# Patient Record
Sex: Female | Born: 2000 | Race: White | Hispanic: No | Marital: Single | Smoking: Former smoker
Health system: Southern US, Community
[De-identification: ages and names within clinical notes are randomized; demographics above are authoritative.]

## PROBLEM LIST (undated history)

## (undated) DIAGNOSIS — F418 Other specified anxiety disorders: Secondary | ICD-10-CM

## (undated) DIAGNOSIS — F319 Bipolar disorder, unspecified: Secondary | ICD-10-CM

## (undated) DIAGNOSIS — F429 Obsessive-compulsive disorder, unspecified: Secondary | ICD-10-CM

---
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
## 2022-05-15 ENCOUNTER — Encounter: Payer: Self-pay | Admitting: Physician Assistant

## 2022-05-15 ENCOUNTER — Ambulatory Visit (INDEPENDENT_AMBULATORY_CARE_PROVIDER_SITE_OTHER): Admitting: Physician Assistant

## 2022-05-15 VITALS — BP 112/79 | HR 81

## 2022-05-15 DIAGNOSIS — F3341 Major depressive disorder, recurrent, in partial remission: Secondary | ICD-10-CM

## 2022-05-15 DIAGNOSIS — F411 Generalized anxiety disorder: Secondary | ICD-10-CM

## 2022-05-15 MED ORDER — SERTRALINE HCL 50 MG PO TABS: 50.0000 mg | ORAL_TABLET | Freq: Every day | ORAL | 1 refills | Status: DC

## 2022-05-15 MED ORDER — BUSPIRONE HCL 5 MG PO TABS: 5.0000 mg | ORAL_TABLET | Freq: Two times a day (BID) | ORAL | 1 refills | Status: DC

## 2022-05-15 MED ORDER — PROPRANOLOL HCL 20 MG PO TABS: 10.0000 mg | ORAL_TABLET | Freq: Two times a day (BID) | ORAL | 0 refills | Status: DC | PRN

## 2022-05-15 NOTE — Progress Notes (Signed)
[facility]  Patient ID: Candice Meyer,  MRN: [id number]  PCP: Nyoka Cowden, MD  Date of Evaluation: 05/15/2022 Time spent:20 minutes  Chief Complaint:  Chief Complaint   Anxiety; Depression; Follow-up    HISTORY/CURRENT STATUS: HPI for routine med check.   She decreased Zoloft b/c suppressed her appetite. Is able to eat more now. No weight loss.  Denies laxative use, calorie restricting, or binging and purging.  The anxiety is a little bit worse since decreasing the Zoloft.  She does have panic attacks occasionally, a few times a week.  She ask specifically about propranolol.  She has read about it and would like to try if appropriate.  She does get a racing heart and feel palpitations when she gets super anxious.  Patient is able to enjoy things.  Energy and motivation are good.  Has been out of college for the summer but will be going back in a week or so.  Starting [employer] studying psychology.  No extreme sadness, tearfulness, or feelings of hopelessness.  Sleeps well most of the time. ADLs and personal hygiene are normal.  Denies any changes in concentration, making decisions, or remembering things.  Denies cutting or any form of self-harm.  Denies suicidal or homicidal thoughts.  Patient denies increased energy with decreased need for sleep, increased talkativeness, racing thoughts, impulsivity or risky behaviors, increased spending, increased libido, grandiosity, increased irritability or anger, paranoia, and no hallucinations.  Denies dizziness, syncope, seizures, numbness, tingling, tremor, tics, unsteady gait, slurred speech, confusion. Denies muscle or joint pain, stiffness, or dystonia.  Individual Medical History/ Review of Systems: Changes? :Yes    dislocation right patella in April, no surgery needed. Is better.   Past medications for mental health diagnoses include: Cymbalta, Hydroxyzine, Gabapentin, Lithobid, Wellbutrin, Zoloft caused her to feel jittery,  (Adderall from a friend, 'wasn't a fan.') Strattera 'didn't like it.' Seroquel caused dizziness.   OD on Wellbutrin. Was in [facility] hospital in [place] for a week, around 2019.   Allergies: Penicillins  Current Medications:  Current Outpatient Medications:    busPIRone (BUSPAR) 5 MG tablet, Take 1 tablet (5 mg total) by mouth 2 (two) times daily., Disp: 60 tablet, Rfl: 1   Multiple Vitamin (MULTIVITAMIN) tablet, Take 1 tablet by mouth daily., Disp: , Rfl:    propranolol (INDERAL) 20 MG tablet, Take 0.5-1 tablets (10-20 mg total) by mouth 2 (two) times daily as needed., Disp: 60 tablet, Rfl: 0   sertraline (ZOLOFT) 50 MG tablet, Take 1 tablet (50 mg total) by mouth daily., Disp: 30 tablet, Rfl: 1 Medication Side Effects: none  Family Medical/ Social History: Changes? See HPI  MENTAL HEALTH EXAM:  Blood pressure 112/79, pulse 81.There is no height or weight on file to calculate BMI.  General Appearance: Casual and Well Groomed  Eye Contact:  Good  Speech:  Clear and Coherent and Normal Rate  Volume:  Normal  Mood:  Anxious  Affect:  Anxious  Thought Process:  Goal Directed and Descriptions of Associations: Circumstantial  Orientation:  Full (Time, Place, and Person)  Thought Content: Logical   Suicidal Thoughts:  No  Homicidal Thoughts:  No  Memory:  WNL  Judgement:  Good  Insight:  Good  Psychomotor Activity:  Normal  Concentration:  Concentration: Fair  Recall:  Good  Fund of Knowledge: Good  Language: Good  Assets:  Desire for Improvement  ADL's:  Intact  Cognition: WNL  Prognosis:  Good    DIAGNOSES:    ICD-10-CM  1. Depression, major, recurrent, in partial remission (HCC)  F33.41     2. Generalized anxiety disorder  F41.1      Receiving Psychotherapy: No   RECOMMENDATIONS:  PDMP was reviewed.  No controlled substances. I provided 20  minutes of face to face time during this encounter, including time spent before and after the visit in records review, medical  decision making, counseling pertinent to today's visit, and charting.   Discussed anxiety. Recommend adding Buspar, to help prevent the anxiety.  It is fine to use the propranolol but only as a rescue medication.  This was explained to her.  Benefits, risks and side effects about both medications were discussed and she accepts.  Reminded her not to make medication changes without discussing with me.  She seems to be tolerating the very low dose of Zoloft better and has not noticed symptoms of depression.  Start BuSpar 5 mg, 1 p.o. twice daily. Start propranolol 20 mg, 1/2-1 p.o. twice daily as needed anxiety. Continue Zoloft 25 mg, 1 p.o. daily.  (I am leaving it as 50 mg daily on her chart, so she will get more for her money but also we will have the option of increasing it if needed.)   Continue multivitamin.  Recommend vitamin D and B complex. Return in 6 wks.  Melony Overly, PA-C

## 2022-05-23 ENCOUNTER — Ambulatory Visit: Admitting: Physician Assistant

## 2022-06-11 ENCOUNTER — Other Ambulatory Visit: Payer: Self-pay | Admitting: Physician Assistant

## 2022-06-20 ENCOUNTER — Ambulatory Visit: Admitting: Physician Assistant

## 2022-07-11 ENCOUNTER — Ambulatory Visit: Admitting: Physician Assistant

## 2022-08-07 ENCOUNTER — Ambulatory Visit (INDEPENDENT_AMBULATORY_CARE_PROVIDER_SITE_OTHER): Admitting: Physician Assistant

## 2022-08-07 ENCOUNTER — Encounter: Payer: Self-pay | Admitting: Physician Assistant

## 2022-08-07 VITALS — BP 108/75 | HR 71 | Wt 132.2 lb

## 2022-08-07 DIAGNOSIS — F411 Generalized anxiety disorder: Secondary | ICD-10-CM

## 2022-08-07 DIAGNOSIS — F900 Attention-deficit hyperactivity disorder, predominantly inattentive type: Secondary | ICD-10-CM | POA: Diagnosis not present

## 2022-08-07 MED ORDER — PROPRANOLOL HCL 20 MG PO TABS: 10.0000 mg | ORAL_TABLET | Freq: Two times a day (BID) | ORAL | 0 refills | Status: AC | PRN

## 2022-08-07 MED ORDER — ATOMOXETINE HCL 18 MG PO CAPS: 18.0000 mg | ORAL_CAPSULE | Freq: Every day | ORAL | 1 refills | Status: DC

## 2022-08-07 NOTE — Progress Notes (Signed)
[facility]  Patient ID: Candice Meyer,  MRN: [id number]  PCP: Helene Kelp, MD  Date of Evaluation: 08/07/2022 Time spent:20 minutes  Chief Complaint:  Chief Complaint   ADD; Anxiety; Follow-up    HISTORY/CURRENT STATUS: HPI for routine med check.   OCD sx, has been reading up on it (psych classes in college) and thinks she has it. No compulsions, just can't get things off her mind. Ruminates, hard time relaxing b/c of it. Thoughts do not take time out of her day or 'control her' in any way.   GAD is present. Started Inderal at [facility] and it's helped a lot. Doesn't use often. Buspar was stopped, didn't think she needed it. No PA, just gets overwhelmed at times.   Thinks she has ADHD and would like to treat it. Took Strattera in the past but doesn't remember it now. She said in the past that she 'didn't like it' but doesn't remember it. Took Adderall from a friend in the past and didn't like it. Needs help staying on task and getting things done in a timely manner.  Patient is able to enjoy things.  Energy and motivation are good.  School is going well, studying psych in college.   No extreme sadness, tearfulness, or feelings of hopelessness.  Sleeps well most of the time. ADLs and personal hygiene are normal.   Appetite has not changed.  Weight is stable.  Denies laxative use, calorie restricting, or binging and purging.   Denies cutting or any form of self-harm.  Denies suicidal or homicidal thoughts.  Patient denies increased energy with decreased need for sleep, increased talkativeness, racing thoughts, impulsivity or risky behaviors, increased spending, increased libido, grandiosity, increased irritability or anger, paranoia, or hallucinations.  Denies dizziness, syncope, seizures, numbness, tingling, tremor, tics, unsteady gait, slurred speech, confusion. Denies muscle or joint pain, stiffness, or dystonia.  Individual Medical History/ Review of Systems: Changes?  :No       Past medications for mental health diagnoses include: Cymbalta, Hydroxyzine, Gabapentin, Lithobid, Wellbutrin, Zoloft caused her to feel jittery, (Adderall from a friend, 'wasn't a fan.') Strattera 'didn't like it.' Seroquel caused dizziness.   OD on Wellbutrin. Was in [facility] hospital in [place] for a week, around 2019.   Allergies: Penicillins  Current Medications:  Current Outpatient Medications:    atomoxetine (STRATTERA) 18 MG capsule, Take 1 capsule (18 mg total) by mouth daily., Disp: 30 capsule, Rfl: 1   Multiple Vitamin (MULTIVITAMIN) tablet, Take 1 tablet by mouth daily., Disp: , Rfl:    propranolol (INDERAL) 20 MG tablet, Take 0.5-1 tablets (10-20 mg total) by mouth 2 (two) times daily as needed., Disp: 60 tablet, Rfl: 0 Medication Side Effects: none  Family Medical/ Social History: Changes? See HPI  MENTAL HEALTH EXAM:  Blood pressure 108/75, pulse 71, weight 132 lb 3.2 oz (60 kg).Body mass index is 23.42 kg/m.  General Appearance: Casual and Well Groomed  Eye Contact:  Good  Speech:  Clear and Coherent and Normal Rate  Volume:  Normal  Mood:  Euthymic  Affect:  Congruent  Thought Process:  Goal Directed and Descriptions of Associations: Circumstantial  Orientation:  Full (Time, Place, and Person)  Thought Content: Logical   Suicidal Thoughts:  No  Homicidal Thoughts:  No  Memory:  WNL  Judgement:  Good  Insight:  Good  Psychomotor Activity:  Normal  Concentration:  Concentration: Fair and Attention Span: Fair  Recall:  Good  Fund of Knowledge: Good  Language: Good  Assets:  Desire for Improvement  ADL's:  Intact  Cognition: WNL  Prognosis:  Good   DIAGNOSES:    ICD-10-CM   1. Generalized anxiety disorder  F41.1     2. Attention deficit hyperactivity disorder (ADHD), predominantly inattentive type  F90.0      Receiving Psychotherapy: No   RECOMMENDATIONS:  PDMP was reviewed.  No controlled substances. I provided 20  minutes of face to face  time during this encounter, including time spent before and after the visit in records review, medical decision making, counseling pertinent to today's visit, and charting.   Discussed options for ADD. Recommend a nonstimulant because possible increased anxiety with a stimulant. Explained options of Strattera, Quelbree, clonodine or guanfacine. We decide to retry Strattera. Benefits, risks, and side effects discussed and she accepts.  Restart strattera 18 mg 1 daily. continue  propranolol 20 mg, 1/2-1 p.o. twice daily as needed anxiety. Continue multivitamin.  Recommend vitamin D and B complex. Recommend therapy. Return in 4 wks.  Melony Overly, PA-C

## 2022-09-12 ENCOUNTER — Ambulatory Visit: Admitting: Physician Assistant
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
## 2023-05-13 ENCOUNTER — Encounter (HOSPITAL_BASED_OUTPATIENT_CLINIC_OR_DEPARTMENT_OTHER): Payer: Self-pay

## 2023-05-13 ENCOUNTER — Other Ambulatory Visit: Payer: Self-pay

## 2023-05-13 DIAGNOSIS — E162 Hypoglycemia, unspecified: Secondary | ICD-10-CM | POA: Diagnosis not present

## 2023-05-13 DIAGNOSIS — R1031 Right lower quadrant pain: Secondary | ICD-10-CM | POA: Insufficient documentation

## 2023-05-13 LAB — CBC
HCT: 38.7 % (ref 36.0–46.0)
Hemoglobin: 13.4 g/dL (ref 12.0–15.0)
MCH: 29.6 pg (ref 26.0–34.0)
MCHC: 34.6 g/dL (ref 30.0–36.0)
MCV: 85.6 fL (ref 80.0–100.0)
Platelets: 333 10*3/uL (ref 150–400)
RBC: 4.52 MIL/uL (ref 3.87–5.11)
RDW: 12.2 % (ref 11.5–15.5)
WBC: 7.1 10*3/uL (ref 4.0–10.5)
nRBC: 0 % (ref 0.0–0.2)

## 2023-05-13 NOTE — ED Triage Notes (Signed)
Pt arrived POV for over a month of right sided abd pain that is intermittent. Reports sharp pain "Feels like the pain is like 4in deep inside me". Denies urinary symptoms, denies n/v/d. Eating does not make pain worse, reports movement does not increase pain. Denies pain at current.

## 2023-05-14 ENCOUNTER — Emergency Department (HOSPITAL_BASED_OUTPATIENT_CLINIC_OR_DEPARTMENT_OTHER)
Admission: EM | Admit: 2023-05-14 | Discharge: 2023-05-14 | Disposition: A | Discharge status: Home / Self Care | Attending: Emergency Medicine | Admitting: Emergency Medicine

## 2023-05-14 DIAGNOSIS — R1031 Right lower quadrant pain: Secondary | ICD-10-CM

## 2023-05-14 HISTORY — DX: Other specified anxiety disorders: F41.8

## 2023-05-14 HISTORY — DX: Obsessive-compulsive disorder, unspecified: F42.9

## 2023-05-14 HISTORY — DX: Bipolar disorder, unspecified: F31.9

## 2023-05-14 NOTE — ED Provider Notes (Signed)
[place] EMERGENCY DEPARTMENT AT [facility] Provider Note   CSN: [id number] Arrival date & time: 05/13/23  2301     History  Chief Complaint  Patient presents with   Abdominal Pain    Candice Meyer is a 22 y.o. female.  The history is provided by the patient.   Patient presents with abdominal pain.  Patient reports she has had brief episodes of abdominal pain over the past month.  It is mostly right-sided.  It last for about 10 seconds, sharp and then resolved.  Reports at times it is worse with exercise.  Eating does not make it worse.  She is pain-free at this time No fevers or vomiting.  No dysuria is reported.  No previous abdominal surgery.  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 Authorizing Provider  atomoxetine (STRATTERA) 18 MG capsule Take 1 capsule (18 mg total) by mouth daily. 08/07/22   Melony Overly T, PA-C  Multiple Vitamin (MULTIVITAMIN) tablet Take 1 tablet by mouth daily.    [provider]  propranolol (INDERAL) 20 MG tablet Take 0.5-1 tablets (10-20 mg total) by mouth 2 (two) times daily as needed. 08/07/22   Cherie Ouch, PA-C      Allergies    Penicillins    Review of Systems   Review of Systems  Constitutional:  Negative for fever.  Gastrointestinal:  Positive for abdominal pain. Negative for vomiting.  Genitourinary:  Negative for dysuria.    Physical Exam Updated Vital Signs BP 110/70 (BP Location: Right Arm)   Pulse 85   Temp 98 F (36.7 C) (Oral)   Resp 16   Ht 1.6 m (5\' 3" )   Wt 57.2 kg   LMP 05/13/2023 (Exact Date)   SpO2 100%   BMI 22.32 kg/m  Physical Exam CONSTITUTIONAL: Well developed/well nourished, no distress typing on her computer HEAD: Normocephalic/atraumatic EYES: EOMI/PERRL, no icterus ENMT: Mucous membranes moist NECK: supple no meningeal signs CV: S1/S2 noted, no  murmurs/rubs/gallops noted LUNGS: Lungs are clear to auscultation bilaterally, no apparent distress ABDOMEN: soft, nontender, no rebound or guarding, bowel sounds noted throughout abdomen GU:no cva tenderness NEURO: Pt is awake/alert/appropriate, moves all extremitiesx4.  No facial droop.   SKIN: warm, color normal PSYCH: no abnormalities of mood noted, alert and oriented to situation  ED Results / Procedures / Treatments   Labs (all labs ordered are listed, but only abnormal results are displayed) Labs Reviewed  COMPREHENSIVE METABOLIC PANEL - Abnormal; Notable for the following components:      Result Value   Potassium 3.3 (*)    Glucose, Bld 59 (*)    Alkaline Phosphatase 22 (*)    All other components within normal limits  URINALYSIS, ROUTINE W REFLEX MICROSCOPIC - Abnormal; Notable for the following components:   Color, Urine COLORLESS (*)    All other components within normal limits  LIPASE, BLOOD  CBC  PREGNANCY, URINE    EKG None  Radiology No results found.  Procedures Procedures    Medications Ordered in ED Medications - No data to display  ED Course/ Medical Decision Making/ A&P                                 Medical Decision Making Amount and/or Complexity of Data Reviewed Labs: ordered.   This  patient presents to the ED for concern of abdominal pain, this involves an extensive number of treatment options, and is a complaint that carries with it a high risk of complications and morbidity.  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  The pertinent results include: Mild hypoglycemia   Test  Considered: Considered CT imaging but since patient is pain-free will defer at this time   Reevaluation: After the interventions noted above, I reevaluated the patient and found that they have :stayed the same  Complexity of problems addressed: Patient's presentation is most consistent with  acute complicated illness/injury requiring diagnostic workup  Disposition: After consideration of the diagnostic results and the patient's response to treatment,  I feel that the patent would benefit from discharge   .    Patient presents for evaluation of abdominal pain she has had intermittently for a month.  She is currently pain-free. Labs overall unremarkable except for mild hypoglycemia but she is eating and drinking. No indication for emergent imaging at this time.  Advised she may need follow-up as an outpatient        Final Clinical Impression(s) / ED Diagnoses Final diagnoses:  Right lower quadrant abdominal pain    Rx / DC Orders ED Discharge Orders     None         Candice Rhine, MD 05/14/23 [phone]

## 2023-05-14 NOTE — ED Notes (Signed)
Pt. Blood sugar is 59. Pt. Given soda at her request

## 2023-05-14 NOTE — ED Notes (Signed)
Pt. Refuses to have BP cuff and O2 sat sensor on

## 2023-05-14 NOTE — Discharge Instructions (Signed)

## 2024-06-20 ENCOUNTER — Telehealth: Payer: Self-pay | Admitting: Physician Assistant

## 2024-06-20 NOTE — Telephone Encounter (Signed)
 Patient has not been seen since 2023. Told her that we could not RF medication. Suggested she contact PCP and she said she didn't have one. She does not have insurance currently and was asking how much an out of pocket visit would be. Transferred her to [place] to discuss.

## 2024-06-20 NOTE — Telephone Encounter (Signed)
 Pt called and said she has restarted some 'old' Zoloft  that she was prescribed back in 2023 in the last month. Is wanting a RF but I told her she would probably need to have an appt. She has insurance issues right now and can't make an appt.

## 2024-07-20 ENCOUNTER — Encounter: Payer: Self-pay | Admitting: Physician Assistant

## 2024-07-20 ENCOUNTER — Ambulatory Visit (INDEPENDENT_AMBULATORY_CARE_PROVIDER_SITE_OTHER): Admitting: Physician Assistant

## 2024-07-20 DIAGNOSIS — F319 Bipolar disorder, unspecified: Secondary | ICD-10-CM | POA: Diagnosis not present

## 2024-07-20 DIAGNOSIS — F900 Attention-deficit hyperactivity disorder, predominantly inattentive type: Secondary | ICD-10-CM | POA: Diagnosis not present

## 2024-07-20 DIAGNOSIS — F411 Generalized anxiety disorder: Secondary | ICD-10-CM | POA: Diagnosis not present

## 2024-07-20 MED ORDER — ARIPIPRAZOLE 10 MG PO TABS: 10.0000 mg | ORAL_TABLET | ORAL | 1 refills | Status: DC

## 2024-07-20 NOTE — Progress Notes (Signed)
 [facility]  Patient ID: Candice Meyer,  MRN: [id number]  PCP: Geralene Ivy, MD  Date of Evaluation: 07/20/2024 Time spent:30 minutes  Chief Complaint:  Chief Complaint   Manic Behavior; Depression    HISTORY/CURRENT STATUS: HPI last appointment 08/07/2022, should have returned 4 weeks later.   States for 6 months this year, she was manic.  Grandiose, risky sexual behaviors, wasn't sleeping well, was paranoid, had delusions off and on, thinking she needed to escape 'the Matrix.'  She's a psychology major in school, and did research about her sx.  Wants a Rx for Zoloft  that took several years ago.  She recently found some old Zoloft  and started it.  States it made her feel really good.  She had more energy was able to stay up late and do things she would not have done.  She does not elaborate.  She quit drinking 1 week ago, and quit smoking weed 4 months ago.  Not working now, lost her job.  Still going to class, but failing all classes b/c she doesn't turn in her work.  In between times of feeling really good she feels depressed, cries easily, does not want to do anything.  Sleeps a lot.  Personal hygiene can be lacking during those times.  It is hard for her to say how often the cycles occur.  Appetite is normal and weight is stable.  She has a hard time focusing and staying on task.  No panic attacks.  No suicidal or homicidal thoughts.  Individual Medical History/ Review of Systems: Changes? :No       Past medications for mental health diagnoses include: Cymbalta , Hydroxyzine , Gabapentin , Lithobid , Wellbutrin, Zoloft  caused her to feel jittery, (Adderall from a friend, 'wasn't a fan.') Strattera  'didn't like it.' Seroquel  caused dizziness (07/20/2024 now says she never took it.)  OD on Wellbutrin. Was in [facility]al hospital in [place] for a week, around 2019.   Allergies: Penicillins  Current Medications:  Current Outpatient Medications:    ARIPiprazole (ABILIFY) 10  MG tablet, Take 1 tablet (10 mg total) by mouth every morning., Disp: 30 tablet, Rfl: 1   Multiple Vitamin (MULTIVITAMIN) tablet, Take 1 tablet by mouth daily., Disp: , Rfl:    atomoxetine  (STRATTERA ) 18 MG capsule, Take 1 capsule (18 mg total) by mouth daily. (Patient not taking: Reported on 07/20/2024), Disp: 30 capsule, Rfl: 1   propranolol  (INDERAL ) 20 MG tablet, Take 0.5-1 tablets (10-20 mg total) by mouth 2 (two) times daily as needed. (Patient not taking: Reported on 07/20/2024), Disp: 60 tablet, Rfl: 0 Medication Side Effects: none  Family Medical/ Social History: Changes? See HPI  MENTAL HEALTH EXAM:  There were no vitals taken for this visit.There is no height or weight on file to calculate BMI.  General Appearance: Casual and Well Groomed  Eye Contact:  Good  Speech:  Clear and Coherent and Normal Rate  Volume:  Normal  Mood:  Euphoric  Affect:  Congruent  Thought Process:  Goal Directed and Descriptions of Associations: Circumstantial  Orientation:  Full (Time, Place, and Person)  Thought Content: Logical   Suicidal Thoughts:  No  Homicidal Thoughts:  No  Memory:  WNL  Judgement:  Good  Insight:  Good  Psychomotor Activity:  Normal  Concentration:  Concentration: Fair and Attention Span: Fair  Recall:  Good  Fund of Knowledge: Good  Language: Good  Assets:  Communication Skills Financial Resources/Insurance Housing Leisure Time Physical Health  ADL's:  Intact  Cognition: WNL  Prognosis:  Good   Labs from 05/13/2023 reviewed.   DIAGNOSES:    ICD-10-CM   1. Bipolar I disorder with rapid cycling (HCC)  F31.9     2. Attention deficit hyperactivity disorder (ADHD), predominantly inattentive type  F90.0     3. Generalized anxiety disorder  F41.1       Receiving Psychotherapy: No   RECOMMENDATIONS:  PDMP was reviewed.  No controlled substances. I provided approximately 30 minutes of face to face time during this encounter, including time spent before and  after the visit in records review, medical decision making, counseling pertinent to today's visit, and charting.   We had a long discussion about her sx. They point toward the dx of bipolar disorder with psychotic features and she needs to be treated with an atypical antipsychotic.  She does not want to start an antipsychotic or a mood stabilizer either for that matter.  States she wants to stay creative.  Restarting Zoloft  would, in my opinion, cause more harm than good since she seems mildly manic at this time.  That could make it worse.  She finally states she is willing to try an antipsychotic but in her research she saw no problem being on an SSRI.  We can discuss that further later but the mania needs to be taken care of first.  If not, she may do things that she regrets which could lead to illnesses, admission to the hospital, for even to legal issues.  She is interested in an LAI if appropriate.  I recommend starting Abilify and if she tolerates that well then we can start Abilify Maintena.  Discussed potential metabolic side effects associated with atypical antipsychotics.  Labs will need to be drawn periodically to monitor metabolic function. Discussed potential risk for movement side effects. Patient understands and accepts these risks and has been advised to contact office if any movement side effects occur.    Start Abilify 10 mg, 1 p.o. every morning. Will recheck labs at the next visit. Recommend therapy. Return in 4 to 6 weeks.  Verneita Cooks, PA-C

## 2024-07-25 ENCOUNTER — Ambulatory Visit: Admitting: Physician Assistant

## 2024-08-12 ENCOUNTER — Telehealth: Payer: Self-pay | Admitting: Physician Assistant

## 2024-08-12 NOTE — Telephone Encounter (Signed)
 Pt  lvm @ 3:15p stating she would like to have a medication adjustment.  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  She wants to switch to Zoloft .  Next appt 11/14

## 2024-08-15 NOTE — Telephone Encounter (Signed)
 Pt was last seen 10/15 and it looks like she wanted Zoloft  at that time. You felt it would do more harm than good because you thought she might be manic.   Pt said she is drinking more and vaping. Reports that when she was doing this previously the Zoloft  was helpful. She would at least like to try it. She has an appt on 11/14 and told her that you would discuss with her at that time. She reports having a good support system, but does not see a therapist.

## 2024-08-15 NOTE — Telephone Encounter (Signed)
 LVM to [facility]

## 2024-08-16 NOTE — Telephone Encounter (Signed)
 Pt was last seen 10/15 and it looks like she wanted Zoloft  at that time. You felt it would do more harm than good because you thought she might be manic.    Pt said she is drinking more and vaping. Reports that when she was doing this previously the Zoloft  was helpful. She would at least like to try it. She has an appt on 11/14 and told her that you would discuss with her at that time. She reports having a good support system, but does not see a therapist.

## 2024-08-16 NOTE — Telephone Encounter (Signed)
I will discuss at visit.

## 2024-08-19 ENCOUNTER — Ambulatory Visit: Payer: Self-pay | Admitting: Physician Assistant

## 2024-08-19 ENCOUNTER — Encounter: Payer: Self-pay | Admitting: Physician Assistant

## 2024-08-19 DIAGNOSIS — Z79899 Other long term (current) drug therapy: Secondary | ICD-10-CM | POA: Diagnosis not present

## 2024-08-19 DIAGNOSIS — E559 Vitamin D deficiency, unspecified: Secondary | ICD-10-CM | POA: Diagnosis not present

## 2024-08-19 DIAGNOSIS — F411 Generalized anxiety disorder: Secondary | ICD-10-CM

## 2024-08-19 DIAGNOSIS — F319 Bipolar disorder, unspecified: Secondary | ICD-10-CM | POA: Diagnosis not present

## 2024-08-19 MED ORDER — LURASIDONE HCL 40 MG PO TABS: 40.0000 mg | ORAL_TABLET | Freq: Every day | ORAL | 1 refills | Status: AC

## 2024-08-19 NOTE — Progress Notes (Signed)
 [facility]  Patient ID: Candice Meyer,  MRN: [id number]  PCP: Geralene Ivy, MD  Date of Evaluation: 08/19/2024 Time spent:35 minutes  Chief Complaint:  Chief Complaint   Follow-up    HISTORY/CURRENT STATUS: HPI  For routine f/u.   Abilify was started on 07/20/2024. Doesn't think it's helping much and wants Zoloft .  She called on 08/12/2024 requesting it.  I had not prescribed it at her visit 07/20/2024 due to the thought that it would increase mania. She states it helped in the past.  She was more creative when on it, had more energy, she wants those feelings/behaviors again. For approx 6 months earlier this year, she was paranoid, was delusional, thinking she was in 'the matrix' and needed to get out. She felt like that several months ago but not recently. Does have racing thoughts and increased talkativeness sometimes.  No reports of grandiosity now.  No increased energy with decreased need for sleep, increased talkativeness, or hallucinations now.   Not crying easily, improved since starting Abilify, no energy, she stopped going to classes about 2 weeks ago, failing most of her classes. Feels heavy, had passive SI. No cutting burning herself.  Patient is able to enjoy things.  Has been hanging out with friends more.  Sleeps well most of the time. ADLs and personal hygiene are normal.   Denies any changes in concentration, making decisions, or remembering things.  No c/o anxiety.  Appetite has not changed.  Weight is stable.  Denies laxative use, calorie restricting, or binging and purging.   Denies cutting or any form of self-harm. No PA.  No SI/HI.  Individual Medical History/ Review of Systems: Changes? :No       Past medications for mental health diagnoses include: Cymbalta , Hydroxyzine , Gabapentin , Lithobid , Wellbutrin caused SI, Zoloft  caused her to feel jittery, (Adderall from a friend, 'wasn't a fan.') Strattera  'didn't like it.' Seroquel  caused dizziness  (07/20/2024 now says she never took it.)  OD on Wellbutrin. Was in Behavioral hospital in [place] for a week, around 2019.   Allergies: Penicillins  Current Medications:  Current Outpatient Medications:    clindamycin (CLEOCIN) 300 MG capsule, Take 300 mg by mouth 3 (three) times daily., Disp: , Rfl:    lurasidone (LATUDA) 40 MG TABS tablet, Take 1 tablet (40 mg total) by mouth daily with breakfast., Disp: 30 tablet, Rfl: 1   Multiple Vitamin (MULTIVITAMIN) tablet, Take 1 tablet by mouth daily., Disp: , Rfl:    propranolol  (INDERAL ) 20 MG tablet, Take 0.5-1 tablets (10-20 mg total) by mouth 2 (two) times daily as needed. (Patient not taking: Reported on 08/19/2024), Disp: 60 tablet, Rfl: 0 Medication Side Effects: none  Family Medical/ Social History: Changes? See HPI  MENTAL HEALTH EXAM:  There were no vitals taken for this visit.There is no height or weight on file to calculate BMI.  General Appearance: Casual and Well Groomed  Eye Contact:  Good  Speech:  Clear and Coherent and Normal Rate  Volume:  Normal  Mood:  Euphoric  Affect:  Congruent  Thought Process:  Goal Directed and Descriptions of Associations: Circumstantial  Orientation:  Full (Time, Place, and Person)  Thought Content: Logical   Suicidal Thoughts:  No  Homicidal Thoughts:  No  Memory:  WNL  Judgement:  Good  Insight:  Good  Psychomotor Activity:  Normal  Concentration:  Concentration: Fair and Attention Span: Fair  Recall:  Good  Fund of Knowledge: Good  Language: Good  Assets:  Communication Skills  Financial Resources/Insurance Housing Leisure Time Physical Health  ADL's:  Intact  Cognition: WNL  Prognosis:  Good    DIAGNOSES:    ICD-10-CM   1. Bipolar I disorder with rapid cycling (HCC)  F31.9 Hemoglobin A1c    Lipid panel    Comprehensive metabolic panel    CBC with Differential/Platelet    TSH    VITAMIN D 25 Hydroxy (Vit-D Deficiency, Fractures)    2. Generalized anxiety disorder  F41.1      3. Encounter for long-term (current) use of medications  Z79.899 Hemoglobin A1c    Lipid panel    Comprehensive metabolic panel    CBC with Differential/Platelet    TSH    VITAMIN D 25 Hydroxy (Vit-D Deficiency, Fractures)    4. Vitamin D deficiency  E55.9 VITAMIN D 25 Hydroxy (Vit-D Deficiency, Fractures)     Receiving Psychotherapy: No   RECOMMENDATIONS:  PDMP was reviewed.  No controlled substances. I provided approximately 35 minutes of face to face time during this encounter, including time spent before and after the visit in records review, medical decision making, counseling pertinent to today's visit, and charting.   She begs me to give her Zoloft . I again denied b/c it may cause, or worsen, a manic episode and isn't appropriate first line treatment for Bipolar disorder. She's considering going to another psych provider to get it from them but still wants to see me. She was told I will not see her if she's under the care of someone else, she will have to choose one or the other.  I explained my rationale for not giving an antidepressant, in my best judgement, it would cause more harm than good.   She doesn't feel like the Abilify is working, but stated she is some better with depression sx. I recommend changing to Latuda.  Discussed potential metabolic side effects associated with atypical antipsychotics.  Labs will need to be drawn periodically to monitor metabolic function. Discussed potential risk for movement side effects. Patient understands and accepts these risks and has been advised to contact office if any movement side effects occur.   D/c abilify  Start Latuda 40 mg daily, with food.  Importance stressed.  Recommend therapy. Labs ordered as above.  Return in 6 weeks.  Verneita Cooks, PA-C

## 2024-10-04 ENCOUNTER — Ambulatory Visit: Admitting: Physician Assistant
